# Patient Record
Sex: Male | Born: 1985 | Race: Black or African American | Hispanic: No | Marital: Single | Smoking: Never smoker
Health system: Southern US, Community
[De-identification: ages and names within clinical notes are randomized; demographics above are authoritative.]

## PROBLEM LIST (undated history)

## (undated) DIAGNOSIS — T7840XA Allergy, unspecified, initial encounter: Secondary | ICD-10-CM

## (undated) DIAGNOSIS — F32A Depression, unspecified: Secondary | ICD-10-CM

## (undated) DIAGNOSIS — M419 Scoliosis, unspecified: Secondary | ICD-10-CM

## (undated) DIAGNOSIS — F329 Major depressive disorder, single episode, unspecified: Secondary | ICD-10-CM

## (undated) HISTORY — DX: Depression, unspecified: F32.A

## (undated) HISTORY — DX: Scoliosis, unspecified: M41.9

## (undated) HISTORY — DX: Major depressive disorder, single episode, unspecified: F32.9

## (undated) HISTORY — DX: Allergy, unspecified, initial encounter: T78.40XA

---
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
## 2009-10-15 ENCOUNTER — Ambulatory Visit: Payer: Self-pay | Admitting: Family

## 2009-10-15 DIAGNOSIS — F341 Dysthymic disorder: Secondary | ICD-10-CM | POA: Insufficient documentation

## 2009-10-15 DIAGNOSIS — N529 Male erectile dysfunction, unspecified: Secondary | ICD-10-CM

## 2009-10-15 DIAGNOSIS — K5289 Other specified noninfective gastroenteritis and colitis: Secondary | ICD-10-CM | POA: Insufficient documentation

## 2009-10-15 LAB — CONVERTED CEMR LAB
Basophils Absolute: 0 10*3/uL (ref 0.0–0.1)
Basophils Relative: 0 % (ref 0–1)
Bilirubin, Direct: 0.2 mg/dL (ref 0.0–0.3)
Indirect Bilirubin: 0.8 mg/dL (ref 0.0–0.9)
Lipase: 20 units/L (ref 0–75)
MCHC: 33.4 g/dL (ref 30.0–36.0)
Neutro Abs: 5.5 10*3/uL (ref 1.7–7.7)
Neutrophils Relative %: 68 % (ref 43–77)
RBC: 5.35 M/uL (ref 4.22–5.81)
RDW: 13.3 % (ref 11.5–15.5)
Testosterone: 432.32 ng/dL (ref 350–890)
Total Bilirubin: 1 mg/dL (ref 0.3–1.2)

## 2009-10-17 ENCOUNTER — Telehealth: Payer: Self-pay | Admitting: Family

## 2009-10-30 ENCOUNTER — Ambulatory Visit: Payer: Self-pay | Admitting: Licensed Clinical Social Worker

## 2009-10-31 ENCOUNTER — Telehealth: Payer: Self-pay | Admitting: Family

## 2009-11-04 ENCOUNTER — Telehealth: Payer: Self-pay | Admitting: Family

## 2009-11-13 ENCOUNTER — Encounter (INDEPENDENT_AMBULATORY_CARE_PROVIDER_SITE_OTHER): Payer: Self-pay | Admitting: *Deleted

## 2010-01-14 ENCOUNTER — Ambulatory Visit: Payer: Self-pay | Admitting: Family

## 2010-01-14 ENCOUNTER — Encounter: Payer: Self-pay | Admitting: Family

## 2010-01-14 ENCOUNTER — Encounter (INDEPENDENT_AMBULATORY_CARE_PROVIDER_SITE_OTHER): Payer: Self-pay | Admitting: *Deleted

## 2010-01-16 LAB — CONVERTED CEMR LAB
Sex Hormone Binding: 25 nmol/L (ref 13–71)
Testosterone Free: 118 pg/mL (ref 47.0–244.0)

## 2010-02-20 ENCOUNTER — Encounter: Payer: Self-pay | Admitting: Family

## 2010-02-25 NOTE — Letter (Signed)
Summary: [place] No Show Letter  [place] at [facility]  [address]   [place], [place] [address]   Phone: [phone]  Fax: [phone]    11/13/2009 MRN: [id number]  Raymond Ho [address] [place], [place]  [address]   Dear [name],   Our records indicate that you missed your scheduled appointment with Sandford Craze  on 11-13-2009.  Please contact this office to reschedule your appointment as soon as possible.  It is important that you keep your scheduled appointments with your physician, so we can provide you the best care possible.  Please be advised that there may be a charge for "no show" appointments.    Sincerely,   [place] at [facility]

## 2010-02-25 NOTE — Letter (Signed)
Summary: Records Dated 02-10-05 thru 05-14-08/[facility]  Records Dated 02-10-05 thru 05-14-08/[facility]   Imported By: Lanelle Bal 10/31/2009 08:56:47  _____________________________________________________________________  External Attachment:    Type:   Image     Comment:   External Document

## 2010-02-25 NOTE — Progress Notes (Signed)
Summary: Cymbalta  alternatives  Phone Note Call from Patient Call back at Home Phone [phone]   Caller: Patient Summary of Call: pt states [facility] will not fill Cymbalta without him trying something else, pls contact pt Initial call taken by: Lannette Donath,  October 17, 2009 3:38 PM  Follow-up for Phone Call        Left message on machine to return my call. Nicki Guadalajara Fergerson CMA Duncan Dull)  October 17, 2009 3:43 PM   Pt states he spoke to [employer] and they told him there are other preferred alternatives to Cymbalta. Called [facility] and reached voicemail. Left detailed message for them to call us back with possible alternatives. Nicki Guadalajara Fergerson CMA Duncan Dull)  October 17, 2009 4:52 PM   Additional Follow-up for Phone Call Additional follow up Details #1::        Per Minerva Areola @ [facility] [phone]: Bupropion & Venlafaxine are preferreed alternatives. Please advise. Nicki Guadalajara Fergerson CMA Duncan Dull)  October 21, 2009 2:30 PM     Additional Follow-up for Phone Call Additional follow up Details #2::    Please call patient and let him know that due to insurance coverage, he should stop Cymbalta and start Effexor.  I have sent 1 month supply to local pharmacy, and sent additional rx to his mail order.  He should still plan to f/u in 1 month. Follow-up by: Lemont Fillers FNP,  October 21, 2009 4:59 PM  Additional Follow-up for Phone Call Additional follow up Details #3:: Details for Additional Follow-up Action Taken: Pt notified of rx change. Pt never picked Cymbalta up. F/u appt scheduled for 11/13/09 @ 3:45pm. Mervin Kung CMA (AAMA)  October 21, 2009 5:28 PM   New/Updated Medications: VENLAFAXINE HCL 37.5 MG TABS (VENLAFAXINE HCL) one tablet by mouth two times a day VENLAFAXINE HCL 37.5 MG TABS (VENLAFAXINE HCL) one tablet by mouth two times a day Prescriptions: VENLAFAXINE HCL 37.5 MG TABS (VENLAFAXINE HCL) one tablet by mouth two times a day   #180 x 0   Entered and Authorized by:   Lemont Fillers FNP   Signed by:   Lemont Fillers FNP on 10/21/2009   Method used:   Electronically to        [facility] [address] (mail-order)       [facility]       [address]       [place], [place]  [address]       Ph: [phone]       Fax: [phone]   RxID:   [id number] VENLAFAXINE HCL 37.5 MG TABS (VENLAFAXINE HCL) one tablet by mouth two times a day  #60 x 0   Entered and Authorized by:   Lemont Fillers FNP   Signed by:   Lemont Fillers FNP on 10/21/2009   Method used:   Electronically to        [facility] [address] (retail)       [phone]       [place], [place]  [address]       Ph: [phone]       Fax: [phone]   RxID:   [id number]

## 2010-02-25 NOTE — Progress Notes (Signed)
Summary: Cialis Rx  Phone Note Call from Patient Call back at Home Phone [phone]   Caller: Mom Reason for Call: Talk to Nurse Summary of Call: pt states he was only able to get half of his Rx of Cialis at pharmacy, pharmacist told him he would have to get in contact with Dr before they would fill rest of Rx, pls call pt Initial call taken by: Lannette Donath,  October 31, 2009 2:53 PM  Follow-up for Phone Call        Pt wanted to verify the quantity that Cialis was written for. He states he only got 6 from the pharmacy. Advised pt that #6 is the quantity that was prescribed. Pt satisfied with answer. Nicki Guadalajara Fergerson CMA Duncan Dull)  November 01, 2009 11:45 AM

## 2010-02-25 NOTE — Letter (Signed)
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 [address]   [place], [place] [address]   Phone: [phone]  Fax: [phone]      October 15, 2009   Employee:  LOYE VENTO    To Whom It May Concern:   For Medical reasons, please excuse the above named employee from work for the following dates:  Start:   10-15-09  End:   10-15-09  If you need additional information, please feel free to contact our office.         Sincerely,    Mervin Kung CMA (AAMA)

## 2010-02-25 NOTE — Assessment & Plan Note (Signed)
Summary: NEW PT UPSET STOMACH/DT--Rm 4   Vital Signs:  Patient profile:   25 year old male Height:      140 inches Weight:      70 pounds BMI:     2.52 Temp:     98.2 degrees F oral Pulse rate:   66 / minute Pulse rhythm:   regular Resp:     16 per minute BP sitting:   130 / 84  (right arm) Cuff size:   regular  Vitals Entered By: Mervin Kung CMA Duncan Dull) (October 15, 2009 10:23 AM) CC: Rm 4  New pt to establish care. Has had nausea and dizziness x 1 wk. Intermittent vomiting and diarrhea., Depression Is Patient Diabetic? No Pain Assessment Patient in pain? no        CC:  Rm 4  New pt to establish care. Has had nausea and dizziness x 1 wk. Intermittent vomiting and diarrhea. and Depression.  History of Present Illness: Raymond Ho is a 25 year old male who presents today to establish care.  He has several concerns today.  1)Nausea, dizziness, occasional diarrhea, vomitting last night.  Started about 2 weeks ago.  There were some work colleagues who had gi bugs.  He had mild fever last night (tactile temp per girlfriend).   Denies coffee ground emesis,  sometimes water- color is normal brown.  Denies abdominal pain.  Mild cough, mild sore throat one morning.    2) Depression- has been treated with lexapro in the past.  + anxiety- stress with work, mom depends on him for transportation, has son in [place]- lives with his mom.   sometimes hard to get up in the AM.  Denies suicide ideation.   Had panic attack on Saturday following argument with his girlfriend.    3) Erectile Dysfunction-  patient reports that the last 3 times that he has tried to engage in sexual activity,  he has been unable to acheive an erection.  Now he is scared to attempt intercourse for fear of the same.  This has been causing some stress in his relationship with his girlfriend.  Reports that his libido is "normal."  Depression History:      The patient denies significant weight gain, insomnia, and  fatigue (loss of energy).        Comments:  has some low energy/anxiety.   Preventive Screening-Counseling & Management  Alcohol-Tobacco     Alcohol drinks/day: <1     Smoking Status: never  Caffeine-Diet-Exercise     Caffeine use/day: 2 coffees or tea     Does Patient Exercise: no  Allergies (verified): No Known Drug Allergies  Past History:  Past Medical History: Depression Allergies Scoliosis  Past Surgical History: Scoliosis--2002  Family History: Mother-- living, HTN Father-- living, ?liver prob.-  ?ETOH 1 sister-- healthy 1 son--Braden- November 2010 healthy  Social History: Occupation: Holiday representative, works at [employer]. attending college at [employer]- has own apartment, but spends most of his time at girlfriend's apt. Never Smoked Alcohol use-yes, 2-3 drinks on weekends Regular exercise-no Smoking Status:  never Caffeine use/day:  2 coffees or tea Does Patient Exercise:  no  Review of Systems       Constitutional: low grade fever ENT:  Denies nasal congestion or mild sore throat- resolved Resp: + dry cough CV:  Denies Chest Pain, mild SOB GI:  see HPI GU: Denies dysuria, + urinary frequency Lymphatic: Denies lymphadenopathy Musculoskeletal:  Denies muscle/joint pain- occasional back pain Skin:  Denies  Rashes or lesions Psychiatric: see HPI Neuro: occasional numbness in left foot- rarely     Physical Exam  General:  Thin AA male, pleasant, awake, alert, and in NAD Head:  Normocephalic and atraumatic without obvious abnormalities. No apparent alopecia or balding. Neck:  No deformities, masses, or tenderness noted. Lungs:  Normal respiratory effort, chest expands symmetrically. Lungs are clear to auscultation, no crackles or wheezes. Heart:  Normal rate and regular rhythm. S1 and S2 normal without gallop, murmur, click, rub or other extra sounds. Abdomen:  Bowel sounds positive,abdomen soft and non-tender without masses, organomegaly or  hernias noted. Psych:  Oriented X3, normally interactive, good eye contact, not anxious appearing, and not depressed appearing.     Impression & Recommendations:  Problem # 1:  ANXIETY DEPRESSION (ICD-300.4) Assessment Deteriorated Clnically deteriorated.  Has tried Lexapro in the past with minimal improvement.  Will give patient trial of cymbalta.  Pt was instructed to call us if he develops worsening anxiety/depression- go to ER if he develops suicidal thoughts.  Pt verbalizes understanding.    Problem # 2:  GASTROENTERITIS, ACUTE (ICD-558.9) Assessment: New Suspect that his GI complaints are due to acute viral illness.  Will check baseline labs.  If symptoms worsen or are not improved, will consider abdominal ultrasound +/- GI referral.  His updated medication list for this problem includes:    Zofran 4 Mg Tabs (Ondansetron hcl) ..... 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  Will plan to check testosterone level and will give pt. a trial of cialis.  His updated medication list for this problem includes:    Cialis 5 Mg Tabs (Tadalafil) .Marland Kitchen... 1 tablet by mouth 1 hour before sexual activity  Orders: T-Testosterone; Total [phone])  Complete Medication List: 1)  Cymbalta 30 Mg Cpep (Duloxetine hcl) .... One tablet by mouth daily x 1 week, then increase to one tablet by mouth two times a day on second week 2)  Zofran 4 Mg Tabs (Ondansetron hcl) .... One tablet by mouth three times a day as needed nausea 3)  Cialis 5 Mg Tabs (Tadalafil) .Marland Kitchen.. 1 tablet by mouth 1 hour before sexual activity  Patient Instructions: 1)  Please follow up in 1 week, call sooner if symptoms worsen or do not improve.  2)  It will likely  take several weeks before you will notice improvement. 3)  Side effects of this medicine may include drowsiness or nausea.  If this becomes an issue for you call for further instructions. 4)  Very rarely people may develop suicidal thoughts when taking these types of medicines- should this happen to you, discontinue medication and go directly to the emergency room. Prescriptions: CIALIS 5 MG TABS (TADALAFIL) 1 tablet by mouth 1 hour before sexual activity  #6 x 2   Entered and Authorized by:   Lemont Fillers FNP   Signed by:   Lemont Fillers FNP on 10/15/2009   Method used:   Electronically to        [facility] [address] (retail)       [phone]       [place], [place]  [address]       Ph: [phone]       Fax: [phone]  RxID:   [id number] ZOFRAN 4 MG TABS (ONDANSETRON HCL) one tablet by mouth three times a day as needed nausea  #30 x 0   Entered and Authorized by:   Lemont Fillers FNP   Signed by:   Lemont Fillers FNP on 10/15/2009   Method used:   Electronically to        [facility] [address] (retail)       [phone]       [place], [place]  [address]       Ph: [phone]       Fax: [phone]   RxID:   [id number] CYMBALTA 30 MG CPEP (DULOXETINE HCL) one tablet by mouth daily x 1 week, then increase to one tablet by mouth two times a day on second week  #60 x 1   Entered and Authorized by:   Lemont Fillers FNP   Signed by:   Lemont Fillers FNP on 10/15/2009   Method used:   Electronically to        [facility] [address] (retail)       [phone]       [place], [place]  [address]       Ph: [phone]       Fax: [phone]   RxID:   [id number]   Current Allergies (reviewed today): No known allergies    Preventive Care Screening  Last Tetanus Booster:    Date:  04/26/2009    Results:  Historical      Appended Document: NEW PT UPSET STOMACH/DT--Rm 4     Allergies: No Known Drug Allergies   Impression & Recommendations:  Problem # 1:  ANXIETY DEPRESSION (ICD-300.4)  Orders: Psychology Referral (Psychology)  Complete Medication List: 1)  Cymbalta 30 Mg Cpep (Duloxetine hcl) .... One tablet by mouth daily x 1 week, then increase to one tablet by mouth two times a day on second week 2)  Zofran 4 Mg Tabs (Ondansetron hcl) .... One tablet by mouth three times a day as needed nausea 3)  Cialis 5 Mg Tabs (Tadalafil) .Marland Kitchen.. 1 tablet by mouth 1 hour before sexual activity

## 2010-02-25 NOTE — Progress Notes (Signed)
Summary: Cialis Refill  Phone Note Call from Patient Call back at Home Phone [phone]   Caller: Patient Call For: Lemont Fillers FNP Summary of Call: patient was seen 3 week ago and he was given a rx for Cialis, he would like to know if he could get a refill, he is out of the medication. He was informed a rx was sent to the pharmacy on 9/20 with a refill. He was advised to check with the pharmacy for refill of medication Initial call taken by: Glendell Docker CMA,  November 04, 2009 2:55 PM

## 2010-02-27 NOTE — Assessment & Plan Note (Signed)
Summary: 2 MONTH FOLLOW UP/MHF--Rm 4   Vital Signs:  Patient profile:   25 year old male Height:      70 inches Weight:      150 pounds BMI:     21.60 Temp:     98.3 degrees F oral Pulse rate:   66 / minute Pulse rhythm:   regular Resp:     16 per minute BP sitting:   140 / 88  (right arm) Cuff size:   regular  Vitals Entered By: Mervin Kung CMA Duncan Dull) (January 14, 2010 8:47 AM) CC: Pt here for 2 month follow up. Is Patient Diabetic? No Pain Assessment Patient in pain? no      Comments Pt states he has not been using Cialis. Nicki Guadalajara Fergerson CMA Duncan Dull)  January 14, 2010 8:52 AM    CC:  Pt here for 2 month follow up.Marland Kitchen  History of Present Illness: Raymond Ho is a 24 year old male who presents for follow up.  1)Depression/anxiety- + nausea with the venlafaxine- takes with food, improves.  Not as overwhelmed.  Has not been procrastinating.  Less depressed, fewer "blue" days.  Wishes to continue effexor  2) Hx of Gastroenterities GI symptoms are resolved.    3) Cialis- not working.  Does have spontaneous AM erections, but difficulty maintaining erection.  Causing stress in his relationship.  Allergies (verified): No Known Drug Allergies  Past History:  Past Medical History: Last updated: 10/15/2009 Depression Allergies Scoliosis  Past Surgical History: Last updated: 10/15/2009 Scoliosis--2002  Review of Systems       see HPI  Physical Exam  General:  Well-developed,well-nourished,in no acute distress; alert,appropriate and cooperative throughout examination Lungs:  Normal respiratory effort, chest expands symmetrically. Lungs are clear to auscultation, no crackles or wheezes. Heart:  Normal rate and regular rhythm. S1 and S2 normal without gallop, murmur, click, rub or other extra sounds. Psych:  Cognition and judgment appear intact. Alert and cooperative with normal attention span and concentration. No apparent delusions, illusions,  hallucinations   Impression & Recommendations:  Problem # 1:  ANXIETY DEPRESSION (ICD-300.4) Assessment Improved Symptoms are improved.  Insurance would not cover cymbalta.  He reports that side effects are tolerable.  Wishes to continue Effexor.  15 minutes spent with patient.  Greater than 50% of this time was spent counseling pt on his depression and anxiety.  Problem # 2:  ERECTILE DYSFUNCTION, ORGANIC (ICD-607.84) Check Testoterone level, refer to Urology for formal evaluation.  Stop Cialis- not helping.  The following medications were removed from the medication list:    Cialis 5 Mg Tabs (Tadalafil) .Marland Kitchen... 1 tablet by mouth 1 hour before sexual activity  Orders: T-Testosterone, Free and Total [phone]) Urology Referral (Urology)  Complete Medication List: 1)  Venlafaxine Hcl 37.5 Mg Tabs (Venlafaxine hcl) .... One tablet by mouth two times a day  Patient Instructions: 1)  You will be contacted about your referral to Urologist. 2)  Please complete your lab work on the first floor.  3)  Please follow up in 3 months, sooner if problems or concerns.   Prescriptions: VENLAFAXINE HCL 37.5 MG TABS (VENLAFAXINE HCL) one tablet by mouth two times a day  #180 x 0   Entered and Authorized by:   Lemont Fillers FNP   Signed by:   Lemont Fillers FNP on 01/14/2010   Method used:   Electronically to        [facility] [address] (retail)  [address]       [place], [place]  [address]       Ph: [phone]       Fax: [phone]   RxID:   [id number]    Orders Added: 1)  T-Testosterone, Free and Total [phone] 2)  Urology Referral [Urology] 3)  Est. Patient Level III [[address]]    Contraindications/Deferment of Procedures/Staging:    Test/Procedure: FLU VAX    Reason for deferment: patient declined   Current Allergies (reviewed today): No known allergies

## 2010-02-27 NOTE — Letter (Signed)
Summary: Primary Care Consult Scheduled Letter  [place] at [facility]  [address]   [place], [place] [address]   Phone: [phone]  Fax: [phone]      01/14/2010 MRN: [id number]  Raymond Ho [address] [place], [place]  [address]    Dear Mr. [place],      We have scheduled an appointment for you.  At the recommendation of MELISSA O'SULLIVAN, FNP , we have scheduled you a consult with [facility], DR Retta Diones  on February 20, 2010  at 11AM.  Their address is_[facility], [address],[place]  . The office phone number is [phone].  If this appointment day and time is not convenient for you, please feel free to call the office of the doctor you are being referred to at the number listed above and reschedule the appointment.     It is important for you to keep your scheduled appointments. We are here to make sure you are given good patient care.   Thank you,  Darral Dash Patient Care Coordinator [place] at [facility]

## 2010-03-13 NOTE — Consult Note (Signed)
Summary: [facility]  [facility]   Imported By: Lanelle Bal 03/07/2010 11:21:06  _____________________________________________________________________  External Attachment:    Type:   Image     Comment:   External Document

## 2010-04-08 ENCOUNTER — Ambulatory Visit: Payer: Self-pay | Admitting: Family

## 2010-04-14 ENCOUNTER — Ambulatory Visit: Payer: Self-pay | Admitting: Family

## 2010-04-15 ENCOUNTER — Encounter: Payer: Self-pay | Admitting: Family

## 2010-04-16 ENCOUNTER — Encounter: Payer: Self-pay | Admitting: Family

## 2010-04-16 ENCOUNTER — Ambulatory Visit (INDEPENDENT_AMBULATORY_CARE_PROVIDER_SITE_OTHER): Payer: 59 | Admitting: Family

## 2010-04-16 ENCOUNTER — Telehealth: Payer: Self-pay | Admitting: Family

## 2010-04-16 ENCOUNTER — Other Ambulatory Visit: Payer: Self-pay | Admitting: Family

## 2010-04-16 ENCOUNTER — Ambulatory Visit (HOSPITAL_BASED_OUTPATIENT_CLINIC_OR_DEPARTMENT_OTHER)
Admission: RE | Admit: 2010-04-16 | Discharge: 2010-04-16 | Disposition: A | Discharge status: Home / Self Care | Payer: 59 | Source: Ambulatory Visit | Attending: Family | Admitting: Family

## 2010-04-16 VITALS — BP 140/78 | HR 60 | Temp 98.7°F | Resp 16 | Ht 70.0 in | Wt 151.1 lb

## 2010-04-16 DIAGNOSIS — T84498A Other mechanical complication of other internal orthopedic devices, implants and grafts, initial encounter: Secondary | ICD-10-CM | POA: Insufficient documentation

## 2010-04-16 DIAGNOSIS — N529 Male erectile dysfunction, unspecified: Secondary | ICD-10-CM

## 2010-04-16 DIAGNOSIS — M419 Scoliosis, unspecified: Secondary | ICD-10-CM

## 2010-04-16 DIAGNOSIS — M545 Low back pain, unspecified: Secondary | ICD-10-CM

## 2010-04-16 DIAGNOSIS — M549 Dorsalgia, unspecified: Secondary | ICD-10-CM

## 2010-04-16 DIAGNOSIS — M4120 Other idiopathic scoliosis, site unspecified: Secondary | ICD-10-CM

## 2010-04-16 DIAGNOSIS — F341 Dysthymic disorder: Secondary | ICD-10-CM

## 2010-04-16 DIAGNOSIS — R030 Elevated blood-pressure reading, without diagnosis of hypertension: Secondary | ICD-10-CM | POA: Insufficient documentation

## 2010-04-16 DIAGNOSIS — Y831 Surgical operation with implant of artificial internal device as the cause of abnormal reaction of the patient, or of later complication, without mention of misadventure at the time of the procedure: Secondary | ICD-10-CM | POA: Insufficient documentation

## 2010-04-16 MED ORDER — VENLAFAXINE HCL 37.5 MG PO TABS: 37.5000 mg | ORAL_TABLET | Freq: Two times a day (BID) | ORAL | Status: DC

## 2010-04-16 NOTE — Patient Instructions (Signed)
You may use Aleve 220mg  twice daily as needed for back pain. Please complete your x-ray on the first floor today. Follow up in 3 months, sooner if worsening low back pain.

## 2010-04-16 NOTE — Assessment & Plan Note (Signed)
Patient anxiety and depression appear well-controlled at this time. Plan to continue current dose of Effexor.

## 2010-04-16 NOTE — Assessment & Plan Note (Signed)
This appears to be resolved. Will monitor.

## 2010-04-16 NOTE — Assessment & Plan Note (Signed)
Patient is noted to have systolic blood pressure today 161. He notes that his mother has history of high blood pressure. I have advised him to follow a low-sodium diet. We will repeat his blood pressure next visit.

## 2010-04-16 NOTE — Telephone Encounter (Signed)
Reviewed x-ray results which note fracture of spinal rod in L2-3 region and plan for  referral to neurosurgery.  Pt to call us if he develops severe or worsening low back pain.

## 2010-04-16 NOTE — Progress Notes (Signed)
  Subjective:    Patient ID: Raymond Ho, male    DOB: 03/07/1985, 25 y.o.   MRN: [id number]  HPI  1) Anxiety/Depression-  Feels like he is better able to prioritize. No further nausea with Effexor.  Denies panic attacks. Rare "blue days."  Sleeping well.  2) Low back pain- hx of spinal surgery at age 49 with hardware, due to scoliosis.  3) Erectile dysfunction- ED seems resolved.  Had tried daily cialis.  Now off.  Saw Urology.  He feels that this has resolved.  Past Medical History  Diagnosis Date  . Allergy   . Depression   . Scoliosis     History   Social History  . Marital Status: Single    Spouse Name: N/A    Number of Children: 1  . Years of Education: N/A   Occupational History  . HEALTH CARE BILLING    Social History Main Topics  . Smoking status: Never Smoker   . Smokeless tobacco: Never Used  . Alcohol Use: 1.2 - 1.8 oz/week    2-3 Cans of beer per week  . Drug Use: Not on file  . Sexually Active: Not on file   Other Topics Concern  . Not on file   Social History Narrative   Regular exercise:  no    Past Surgical History  Procedure Date  . Spine surgery 2002    for scoliosis    Family History  Problem Relation Age of Onset  . Hypertension Mother   . Alcohol abuse Father   . Liver disease Father     No Known Allergies  Current Outpatient Prescriptions on File Prior to Visit  Medication Sig Dispense Refill  . DISCONTD: venlafaxine (EFFEXOR) 37.5 MG tablet Take 37.5 mg by mouth 2 (two) times daily.             Review of Systems See history of present illness. MS:  Low back pain/right low back pain.  Worse on the damp/rainy days.   Hard to sleep on those days.      Objective:   Physical Exam  General: Pleasant African American male awake and alert and in no acute distress.  Cardiovascular S1-S2 regular rate and rhythm no murmurs noted Respiratory: Breath sounds clear to auscultation bilaterally without wheezes rales or  rhonchi Musculoskeletal: No thoracic or lumbar spine tenderness to palpation. Bilateral lower extremity strength is 5 out of 5 Neuro: Steady even gait. 2+ patellar reflexes bilaterally. Psychiatric: Patient is alert and oriented x3. He is calm and pleasant. He does not appear anxious or depressed.       Assessment & Plan:

## 2010-04-16 NOTE — Assessment & Plan Note (Addendum)
25 year old male with history of scoliosis. He is status post hardware placement at age 54. He notes some recent increase in pain. X-ray notes fracture of spinal rod in area of L2-3.  Will refer to neurosurgery.  See phone note.

## 2010-07-14 ENCOUNTER — Ambulatory Visit: Payer: 59 | Admitting: Family

## 2010-07-18 ENCOUNTER — Encounter: Payer: Self-pay | Admitting: Family

## 2010-07-18 ENCOUNTER — Ambulatory Visit: Payer: 59 | Admitting: Family

## 2010-07-18 DIAGNOSIS — Z0289 Encounter for other administrative examinations: Secondary | ICD-10-CM

## 2012-11-05 IMAGING — CR DG THORACIC SPINE 4+V
3 series · 3 of 3 positions shown · non-contrast
Comparison: None.

CLINICAL DATA: Scoliosis with back pain and history of spinal
fusion.

THORACIC SPINE - 4+ VIEW

[w t-spine a.p. *]
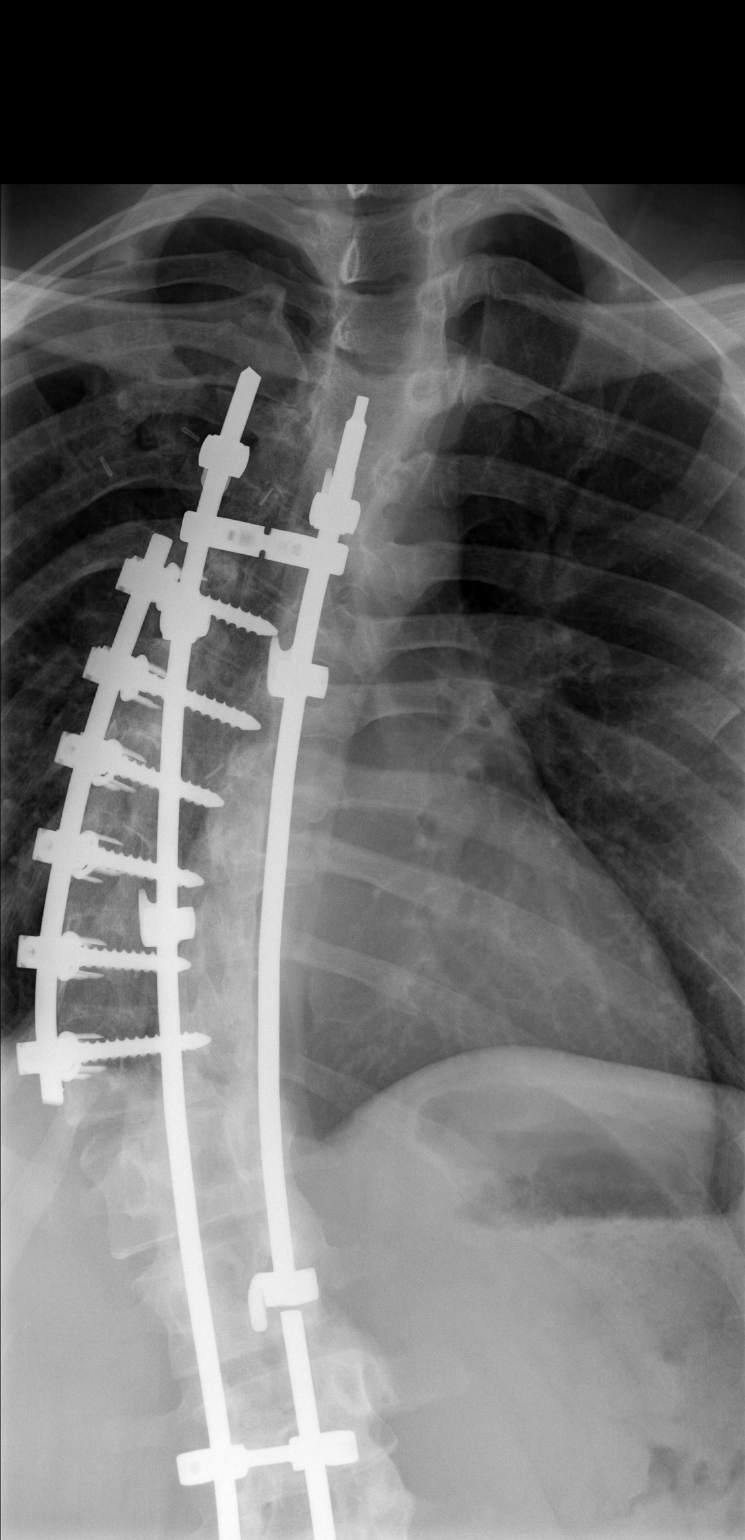

[w t-spine lat *]
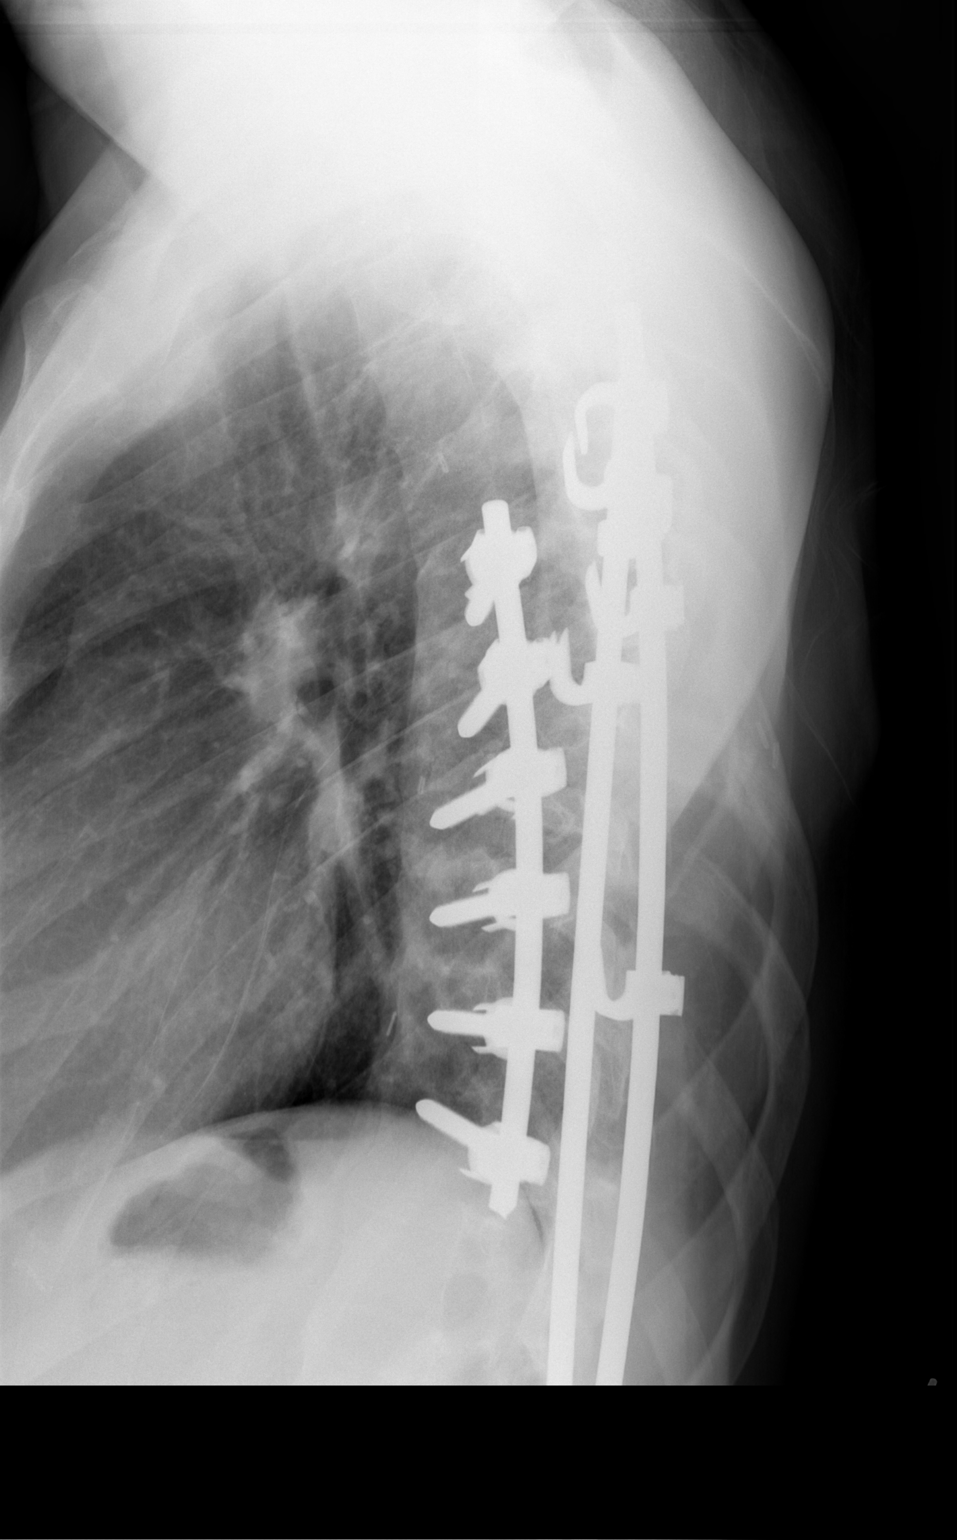

[w swimmers view]
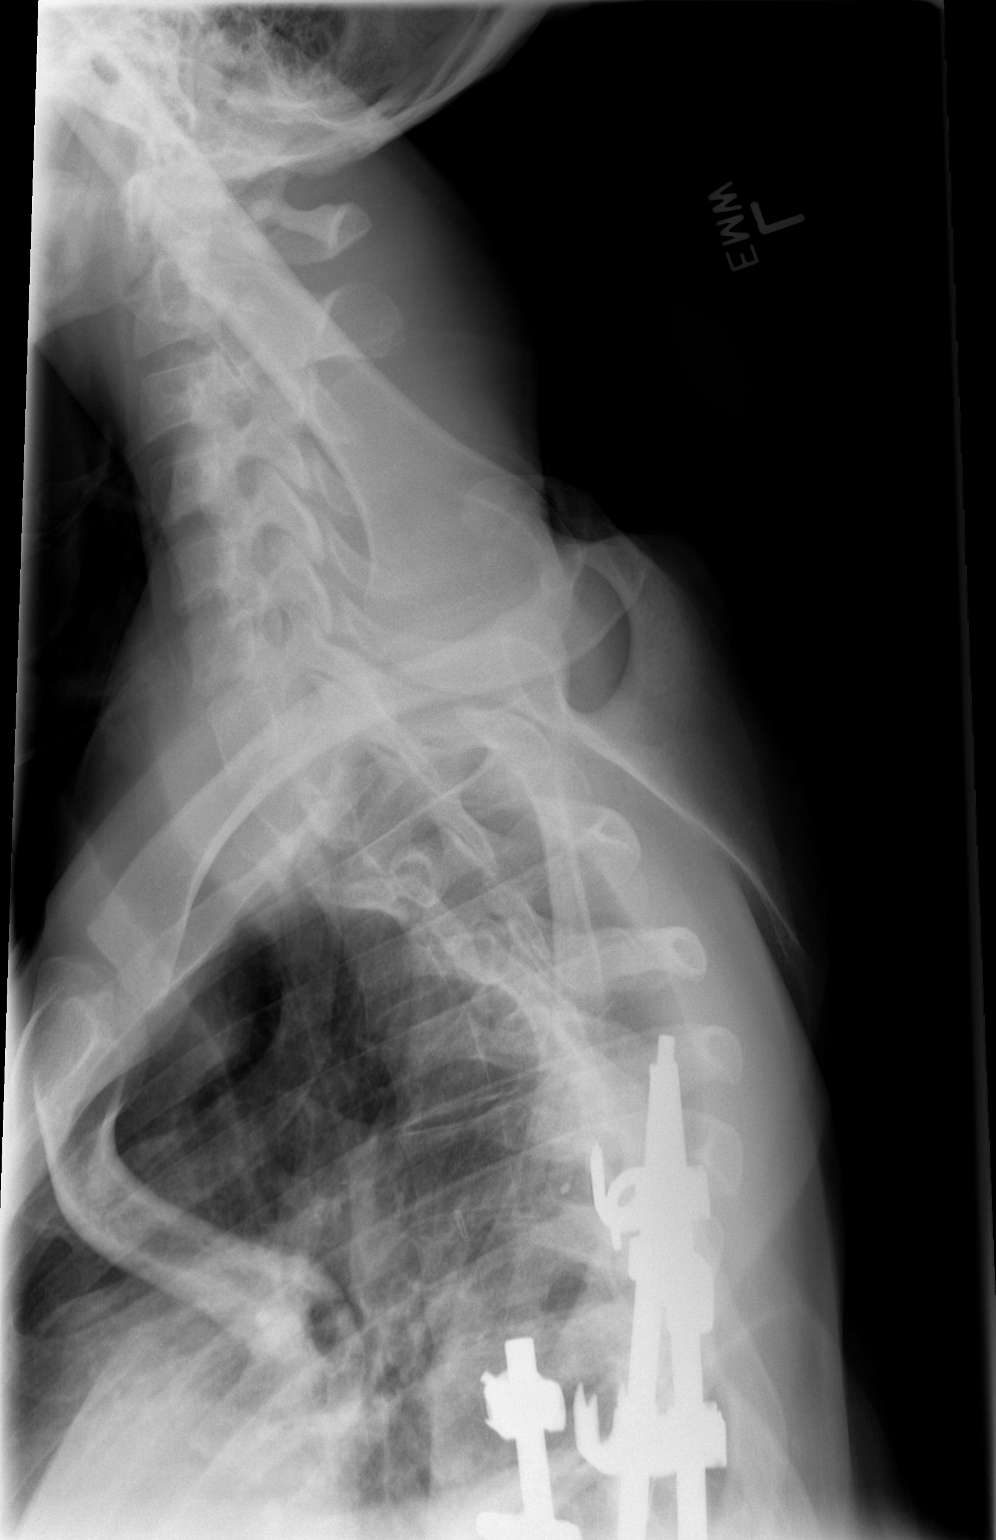

[3 of 3 positions shown; findings below may reference images not displayed]

FINDINGS: Convex rightward lumbar scoliosis noted with apex about
the level of T11.  Reviewing this film along the lumbar spine study
performed the same time, the patient has bilateral spinal fusion
rods which extend from the T6 level caudally to the L5 level.  Left-
sided spinal fusion rod is fractured just caudal to the left L2
pedicle.  The patient has a third lateral spinal fusion rod in the
thoracic spine with lateral screws in all vertebral bodies from the
T7 level to the T12 level.
IMPRESSION: Extensive thoracolumbar spinal fusion secondary to S-shaped
thoracolumbar scoliosis.  Left side the spinal fusion rod is
fractured.

## 2012-11-18 ENCOUNTER — Ambulatory Visit (INDEPENDENT_AMBULATORY_CARE_PROVIDER_SITE_OTHER): Payer: 59 | Admitting: Family

## 2012-11-18 ENCOUNTER — Encounter: Payer: Self-pay | Admitting: Family

## 2012-11-18 ENCOUNTER — Encounter (INDEPENDENT_AMBULATORY_CARE_PROVIDER_SITE_OTHER): Payer: Self-pay

## 2012-11-18 VITALS — BP 130/86 | HR 52 | Temp 98.3°F | Resp 16 | Ht 69.0 in | Wt 162.1 lb

## 2012-11-18 DIAGNOSIS — Z0000 Encounter for general adult medical examination without abnormal findings: Secondary | ICD-10-CM | POA: Insufficient documentation

## 2012-11-18 LAB — LIPID PANEL
Cholesterol: 166 mg/dL (ref 0–200)
HDL: 53 mg/dL (ref >39)
Total CHOL/HDL Ratio: 3.1 Ratio
Triglycerides: 68 mg/dL (ref <150)
VLDL: 14 mg/dL (ref 0–40)

## 2012-11-18 LAB — BASIC METABOLIC PANEL WITH GFR
BUN: 13 mg/dL (ref 6–23)
Calcium: 9.4 mg/dL (ref 8.4–10.5)
GFR, Est African American: >89 mL/min
GFR, Est Non African American: >89 mL/min
Glucose, Bld: 88 mg/dL (ref 70–99)
Potassium: 4.4 mEq/L (ref 3.5–5.3)

## 2012-11-18 LAB — CBC WITH DIFFERENTIAL/PLATELET
Basophils Absolute: 0.1 10*3/uL (ref 0.0–0.1)
Basophils Relative: 1 % (ref 0–1)
Eosinophils Absolute: 0.2 10*3/uL (ref 0.0–0.7)
Hemoglobin: 15.8 g/dL (ref 13.0–17.0)
MCH: 28.8 pg (ref 26.0–34.0)
MCHC: 35.1 g/dL (ref 30.0–36.0)
Neutro Abs: 2.4 10*3/uL (ref 1.7–7.7)
Neutrophils Relative %: 40 % — ABNORMAL LOW (ref 43–77)
Platelets: 386 10*3/uL (ref 150–400)
RDW: 13.2 % (ref 11.5–15.5)

## 2012-11-18 LAB — HEPATIC FUNCTION PANEL
AST: 15 U/L (ref 0–37)
Albumin: 4.3 g/dL (ref 3.5–5.2)
Bilirubin, Direct: 0.1 mg/dL (ref 0.0–0.3)
Total Bilirubin: 0.6 mg/dL (ref 0.3–1.2)

## 2012-11-18 NOTE — Patient Instructions (Signed)
Please complete lab work prior to leaving. Schedule a follow up visit so we can address some of your other medical concerns.

## 2012-11-18 NOTE — Assessment & Plan Note (Signed)
Obtain fasting blood work.  Discussed healthy diet, exercise.

## 2012-11-18 NOTE — Progress Notes (Signed)
Subjective:    Patient ID: Raymond Ho, male    DOB: 1985-11-01, 27 y.o.   MRN: [id number]  HPI  Raymond Ho is a 27 yr old male who presents today for cpx.   Patient presents today for complete physical.  Immunizations:  He will get his flu shot next week at work. Diet:reports diet could be better. Exercise: starting to exercise more.  Does treadmill 20 minutes 3 days a week.   The patient wishes to return another day to address anxiety/depression.   Review of Systems  Constitutional: Negative for unexpected weight change.  HENT: Negative for hearing loss.   Eyes: Negative for visual disturbance.  Respiratory: Negative for shortness of breath.        Reports lingering cough from uri 2 weeks ago- improving.   Cardiovascular: Negative for chest pain.  Gastrointestinal: Negative for nausea, vomiting, diarrhea and anal bleeding.  Genitourinary: Negative for dysuria, frequency and hematuria.  Musculoskeletal: Negative for arthralgias and joint swelling.       Occasional low back pain  Skin: Negative for rash.  Neurological: Negative for headaches.  Hematological: Negative for adenopathy.   Past Medical History  Diagnosis Date  . Allergy   . Depression   . Scoliosis     History   Social History  . Marital Status: Single    Spouse Name: N/A    Number of Children: 1  . Years of Education: N/A   Occupational History  . HEALTH CARE BILLING    Social History Main Topics  . Smoking status: Never Smoker   . Smokeless tobacco: Never Used  . Alcohol Use: 1.2 - 1.8 oz/week    2-3 Cans of beer per week     Comment: 2-3 drinks on weekend  . Drug Use: Not on file  . Sexual Activity: Not on file   Other Topics Concern  . Not on file   Social History Narrative   Works at [employer] alone   Enjoys listening to music, hanging out with friends   Has a son who was born 2010. He shares custody          Past Surgical History  Procedure Laterality Date   . Spine surgery  2002    for scoliosis  . Scoliosis  2002    Family History  Problem Relation Age of Onset  . Hypertension Mother   . Alcohol abuse Father   . Liver disease Father     No Known Allergies  No current outpatient prescriptions on file prior to visit.   No current facility-administered medications on file prior to visit.    BP 130/86  Pulse 52  Temp(Src) 98.3 F (36.8 C) (Oral)  Resp 16  Ht 5\' 9"  (1.753 m)  Wt 162 lb 1.3 oz (73.521 kg)  BMI 23.92 kg/m2  SpO2 99%        Objective:   Physical Exam  Physical Exam  Constitutional: He is oriented to person, place, and time. He appears well-developed and well-nourished. No distress.  HENT:  Head: Normocephalic and atraumatic.  Right Ear: Tympanic membrane and ear canal normal.  Left Ear: Tympanic membrane and ear canal normal.  Mouth/Throat: Oropharynx is clear and moist.  Eyes: Pupils are equal, round, and reactive to light. No scleral icterus.  Neck: Normal range of motion. No thyromegaly present.  Cardiovascular: Normal rate and regular rhythm.   No murmur heard. Pulmonary/Chest: Effort normal and breath sounds normal. No respiratory  distress. He has no wheezes. He has no rales. He exhibits no tenderness.  Abdominal: Soft. Bowel sounds are normal. He exhibits no distension and no mass. There is no tenderness. There is no rebound and no guarding.  Musculoskeletal: He exhibits no edema.  Lymphadenopathy:    He has no cervical adenopathy.  Neurological: He is alert and oriented to person, place, and time.  He exhibits normal muscle tone. Coordination normal.  Skin: Skin is warm and dry.  Psychiatric: He has a normal mood and affect. His behavior is normal. Judgment and thought content normal.          Assessment & Plan:          Assessment & Plan:          Assessment & Plan:

## 2012-11-19 LAB — URINALYSIS, ROUTINE W REFLEX MICROSCOPIC
Bilirubin Urine: NEGATIVE
Glucose, UA: NEGATIVE mg/dL
Hgb urine dipstick: NEGATIVE
Leukocytes, UA: NEGATIVE
pH: 6 (ref 5.0–8.0)

## 2012-11-21 ENCOUNTER — Encounter: Payer: Self-pay | Admitting: Family

## 2012-11-23 ENCOUNTER — Ambulatory Visit (INDEPENDENT_AMBULATORY_CARE_PROVIDER_SITE_OTHER): Payer: 59 | Admitting: Family

## 2012-11-23 ENCOUNTER — Encounter: Payer: Self-pay | Admitting: Family

## 2012-11-23 VITALS — BP 130/80 | HR 66 | Temp 98.1°F | Resp 16 | Ht 69.0 in | Wt 165.0 lb

## 2012-11-23 DIAGNOSIS — F341 Dysthymic disorder: Secondary | ICD-10-CM

## 2012-11-23 MED ORDER — FLUOXETINE HCL 20 MG PO TABS: ORAL_TABLET | ORAL | Status: DC

## 2012-11-23 NOTE — Patient Instructions (Addendum)
Start Fluoxetine 20mg  1/2 tablet by mouth once daily for 1 week, then increase to a full tablet once daily. Follow up in 1 month.

## 2012-11-23 NOTE — Assessment & Plan Note (Addendum)
Patient presents with worsening depression. Mild anxiety.   Start Prozac 20mg , 1/2 tablet once daily x 1 week, then 1 tablet once daily.   I instructed pt to start 1/2 tablet once daily for 1 week and then increase to a full tablet once daily on week two as tolerated.  We discussed common side effects such as nausea, drowsiness and weight gain.  Also discussed rare but serious side effect of suicide ideation.  She is instructed to discontinue medication go directly to ED if this occurs.  Pt verbalizes understanding.  Plan follow up in 1 month to evaluate progress.    Instructed patient to go to the ED if having suicidal or homicidal thoughts.  Follow up in one month. 20 minutes spent with pt today.  >50% of this time was spent counseling pt on anxiety and depression.

## 2012-11-23 NOTE — Progress Notes (Signed)
Subjective:    Patient ID: Raymond Ho, male    DOB: Feb 14, 1985, 27 y.o.   MRN: [id number]  HPI Raymond Ho is a 27 y/o male who presents today with a chief complaint of depression and anxiety.  Patient reports he has had symptoms of depression since age 5, patient tried Lexapro at age 45, and effexor over one year ago, patient reports small changes, but did not like taking medication.  Attempted to try Cymbalta but insurance would not cover.  Patient is open to trying a new medication. Patient reports he has started to feel a decrease in drive over the past 6 to 9 months and reports 2 to 3 bad days per week.  Patient also reports Anxiety, states that he lays in bed at night with his mind racing.  Denies PTSD recently and from childhood.  Patient also denies suicidal and homicidal ideation at this time.   Review of Systems  Constitutional: Negative for activity change.  HENT: Negative for sore throat.   Respiratory: Negative for shortness of breath.   Cardiovascular: Negative for chest pain.  Gastrointestinal: Negative for nausea and vomiting.  Neurological: Negative for light-headedness and headaches.  Psychiatric/Behavioral: Positive for sleep disturbance. Negative for suicidal ideas and self-injury. The patient is nervous/anxious.        Reports anxiety and depression over last 6 to 9 months.   Past Medical History  Diagnosis Date  . Allergy   . Depression   . Scoliosis     History   Social History  . Marital Status: Single    Spouse Name: N/A    Number of Children: 1  . Years of Education: N/A   Occupational History  . HEALTH CARE BILLING    Social History Main Topics  . Smoking status: Never Smoker   . Smokeless tobacco: Never Used  . Alcohol Use: 1.2 - 1.8 oz/week    2-3 Cans of beer per week     Comment: 2-3 drinks on weekend  . Drug Use: Not on file  . Sexual Activity: Not on file   Other Topics Concern  . Not on file   Social History Narrative   Works at  [employer] alone   Enjoys listening to music, hanging out with friends   Has a son who was born 2010. He shares custody          Past Surgical History  Procedure Laterality Date  . Spine surgery  2002    for scoliosis  . Scoliosis  2002    Family History  Problem Relation Age of Onset  . Hypertension Mother   . Alcohol abuse Father   . Liver disease Father     No Known Allergies  No current outpatient prescriptions on file prior to visit.   No current facility-administered medications on file prior to visit.    BP 130/80  Pulse 66  Temp(Src) 98.1 F (36.7 C) (Oral)  Resp 16  Ht 5\' 9"  (1.753 m)  Wt 165 lb (74.844 kg)  BMI 24.36 kg/m2  SpO2 97%       Objective:   Physical Exam  Constitutional: He is oriented to person, place, and time. He appears well-nourished.  Cardiovascular: Normal rate and regular rhythm.   Pulmonary/Chest: Breath sounds normal. No respiratory distress.  Neurological: He is alert and oriented to person, place, and time.  Skin: Skin is warm and dry.  Psychiatric: He has a normal mood and affect. His  behavior is normal.          Assessment & Plan:

## 2012-12-13 ENCOUNTER — Telehealth: Payer: Self-pay | Admitting: *Deleted

## 2012-12-13 NOTE — Telephone Encounter (Signed)
Patient reports having hypersensitivity w/panic attack, Anxiety & Nausea, believes to be caused by Fluoxetine started on 10.29.14/SLS Please Advise.

## 2012-12-13 NOTE — Telephone Encounter (Signed)
Returned pt's call.  No answer. Did not leave message.   Please ask the pt how he was feeling on the prozac 10mg - if he was tolerating this better than the 20mg , then he should drop back to 10.  If he did not feel well on the 10mg , then we can try switching him to a different medication such as paxil to see if he can better tolerate.

## 2012-12-14 NOTE — Telephone Encounter (Signed)
Pt returned my call.  Reports one panic attack in the last 2 weeks on prozac 20mg .  Occasional nervousness. Advised pt to continue prozac 20mg  and keep apt on 11/24 and we will re-evaluate at that time. He verbalizes understanding.

## 2012-12-19 ENCOUNTER — Encounter: Payer: Self-pay | Admitting: Family

## 2012-12-19 ENCOUNTER — Ambulatory Visit (INDEPENDENT_AMBULATORY_CARE_PROVIDER_SITE_OTHER): Payer: 59 | Admitting: Family

## 2012-12-19 VITALS — BP 134/88 | HR 58 | Temp 98.1°F | Resp 16 | Ht 69.0 in | Wt 166.1 lb

## 2012-12-19 DIAGNOSIS — F341 Dysthymic disorder: Secondary | ICD-10-CM

## 2012-12-19 MED ORDER — VENLAFAXINE HCL ER 37.5 MG PO CP24: ORAL_CAPSULE | ORAL | Status: DC

## 2012-12-19 MED ORDER — FLUOXETINE HCL 20 MG PO TABS: 20.0000 mg | ORAL_TABLET | Freq: Every day | ORAL | Status: DC

## 2012-12-19 NOTE — Patient Instructions (Signed)
Continue prozac, add effexor. Follow up in 6 weeks.

## 2012-12-19 NOTE — Progress Notes (Signed)
Pre visit review using our clinic review tool, if applicable. No additional management support is needed unless otherwise documented below in the visit note. 

## 2012-12-19 NOTE — Progress Notes (Signed)
  Subjective:    Patient ID: Raymond Ho, male    DOB: 11-06-1985, 27 y.o.   MRN: [id number]  HPI [name] is a 27 year old male who presents today for follow up of anxiety/depression.  Patient was started on Prozac 20mg  (10mg  x 1 week, then 20mg ). Patient reports noticing a panicking feeling with racing thoughts last Sunday (8 days ago).  Patient reports generally feeling only a slight change of improvement over 4 weeks. Denies SI/HI.  Also reporting generalized unilateral frontal headache x 1 week.    Review of Systems  Constitutional: Negative for appetite change and unexpected weight change.  Respiratory: Negative for shortness of breath.   Cardiovascular: Negative for chest pain.  Neurological: Positive for headaches.       Reports left frontal headaches x 1 week intermittently.  Psychiatric/Behavioral: Positive for decreased concentration. Negative for suicidal ideas and sleep disturbance. The patient is nervous/anxious.        Reports he still feels anxious with intermittent racing thoughts.   Past Medical History  Diagnosis Date  . Allergy   . Depression   . Scoliosis     History   Social History  . Marital Status: Single    Spouse Name: N/A    Number of Children: 1  . Years of Education: N/A   Occupational History  . HEALTH CARE BILLING    Social History Main Topics  . Smoking status: Never Smoker   . Smokeless tobacco: Never Used  . Alcohol Use: 1.2 - 1.8 oz/week    2-3 Cans of beer per week     Comment: 2-3 drinks on weekend  . Drug Use: Not on file  . Sexual Activity: Not on file   Other Topics Concern  . Not on file   Social History Narrative   Works at [employer] alone   Enjoys listening to music, hanging out with friends   Has a son who was born 2010. He shares custody          Past Surgical History  Procedure Laterality Date  . Spine surgery  2002    for scoliosis  . Scoliosis  2002    Family History  Problem Relation  Age of Onset  . Hypertension Mother   . Alcohol abuse Father   . Liver disease Father     No Known Allergies  No current outpatient prescriptions on file prior to visit.   No current facility-administered medications on file prior to visit.    BP 134/88  Pulse 58  Temp(Src) 98.1 F (36.7 C) (Oral)  Resp 16  Ht 5\' 9"  (1.753 m)  Wt 166 lb 1.9 oz (75.352 kg)  BMI 24.52 kg/m2  SpO2 99%       Objective:   Physical Exam  Constitutional: He is oriented to person, place, and time. He appears well-nourished.  Cardiovascular: Normal rate, regular rhythm and normal heart sounds.   Pulmonary/Chest: Effort normal and breath sounds normal. No respiratory distress.  Neurological: He is alert and oriented to person, place, and time.  Skin: Skin is warm and dry.  Psychiatric: He has a normal mood and affect. His behavior is normal.          Assessment & Plan:

## 2012-12-19 NOTE — Assessment & Plan Note (Signed)
Patient reports slight improvement.  Continue Prozac 20mg , add effexor 37.5mg  1 tablet daily x 1 week, then 2 tablets daily ongoing. Follow up in 6 weeks.

## 2013-02-03 ENCOUNTER — Ambulatory Visit: Payer: 59 | Admitting: Family

## 2013-02-03 DIAGNOSIS — Z0289 Encounter for other administrative examinations: Secondary | ICD-10-CM

## 2013-02-13 ENCOUNTER — Ambulatory Visit: Payer: 59 | Admitting: Family

## 2013-02-20 ENCOUNTER — Encounter: Payer: Self-pay | Admitting: Family

## 2013-02-20 ENCOUNTER — Ambulatory Visit (INDEPENDENT_AMBULATORY_CARE_PROVIDER_SITE_OTHER): Payer: 59 | Admitting: Family

## 2013-02-20 VITALS — BP 138/98 | HR 71 | Temp 98.5°F | Resp 16 | Ht 69.0 in | Wt 170.1 lb

## 2013-02-20 DIAGNOSIS — F341 Dysthymic disorder: Secondary | ICD-10-CM

## 2013-02-20 DIAGNOSIS — R030 Elevated blood-pressure reading, without diagnosis of hypertension: Secondary | ICD-10-CM

## 2013-02-20 NOTE — Patient Instructions (Addendum)
Continue Effexor and Prozac as prescribed. Follow up in 2 weeks so we can recheck your blood pressure.

## 2013-02-20 NOTE — Assessment & Plan Note (Addendum)
BP Readings from Last 3 Encounters:  02/20/13 138/98  12/19/12 134/88  11/23/12 130/80   Patient noted to have high blood pressure today in office. Started Effexor in November 2014 which I suspect may be contributing. Patient to follow up in two weeks for recheck of blood pressure.

## 2013-02-20 NOTE — Assessment & Plan Note (Addendum)
Stable on medications. Continue Prozac and Effexor. Blood pressure noted to be elevated today in office. Patient to return in 2 weeks for recheck.

## 2013-02-20 NOTE — Progress Notes (Signed)
Pre visit review using our clinic review tool, if applicable. No additional management support is needed unless otherwise documented below in the visit note. 

## 2013-02-20 NOTE — Progress Notes (Signed)
Subjective:    Patient ID: Raymond Ho, male    DOB: July 26, 1985, 28 y.o.   MRN: [id number]  HPI Raymond Ho is a 28 year old male who presents today for follow up. Patient was placed on Prozac 20mg  in October 2014 and experienced several panic attacks. Patient followed up in November 2014 and added on Effexor 37.5 twice daily.  Patient reports a significant difference in his anxiety since the initiation of Effexor with the Prozac. Patient reports minor anxiety in stressful situations. Patient denies panic attacks since visit in November. Denies SI/HI.   Review of Systems  Respiratory: Negative for shortness of breath.   Cardiovascular: Negative for chest pain.  Neurological: Negative for light-headedness and headaches.  Psychiatric/Behavioral: Negative for suicidal ideas, sleep disturbance, decreased concentration and agitation. The patient is not nervous/anxious.    Past Medical History  Diagnosis Date  . Allergy   . Depression   . Scoliosis     History   Social History  . Marital Status: Single    Spouse Name: N/A    Number of Children: 1  . Years of Education: N/A   Occupational History  . HEALTH CARE BILLING    Social History Main Topics  . Smoking status: Never Smoker   . Smokeless tobacco: Never Used  . Alcohol Use: 1.2 - 1.8 oz/week    2-3 Cans of beer per week     Comment: 2-3 drinks on weekend  . Drug Use: Not on file  . Sexual Activity: Not on file   Other Topics Concern  . Not on file   Social History Narrative   Works at [employer] alone   Enjoys listening to music, hanging out with friends   Has a son who was born 2010. He shares custody          Past Surgical History  Procedure Laterality Date  . Spine surgery  2002    for scoliosis  . Scoliosis  2002    Family History  Problem Relation Age of Onset  . Hypertension Mother   . Alcohol abuse Father   . Liver disease Father     No Known Allergies  Current Outpatient  Prescriptions on File Prior to Visit  Medication Sig Dispense Refill  . FLUoxetine (PROZAC) 20 MG tablet Take 1 tablet (20 mg total) by mouth daily.  30 tablet  2  . venlafaxine XR (EFFEXOR-XR) 37.5 MG 24 hr capsule One tablet by mouth once daily for 1 week. Then increase to 2 tabs by mouth once daily  60 capsule  1   No current facility-administered medications on file prior to visit.    BP 138/98  Pulse 71  Temp(Src) 98.5 F (36.9 C) (Oral)  Resp 16  Ht 5\' 9"  (1.753 m)  Wt 170 lb 1.9 oz (77.166 kg)  BMI 25.11 kg/m2  SpO2 99%       Objective:   Physical Exam  Constitutional: He is oriented to person, place, and time. He appears well-nourished.  Cardiovascular: Normal rate and regular rhythm.   Pulmonary/Chest: Breath sounds normal. No respiratory distress. He has no wheezes.  Neurological: He is alert and oriented to person, place, and time.  Skin: Skin is warm and dry.  Psychiatric: He has a normal mood and affect. His behavior is normal.          Assessment & Plan:  I have personally seen and examined patient and agree with Raymond Rieger  NP student's assessment and plan.

## 2013-02-23 ENCOUNTER — Other Ambulatory Visit: Payer: Self-pay | Admitting: Family

## 2013-02-24 ENCOUNTER — Telehealth: Payer: Self-pay | Admitting: Family

## 2013-02-24 ENCOUNTER — Other Ambulatory Visit: Payer: Self-pay | Admitting: Family

## 2013-02-24 MED ORDER — VENLAFAXINE HCL ER 37.5 MG PO CP24: ORAL_CAPSULE | ORAL | Status: DC

## 2013-02-24 NOTE — Telephone Encounter (Signed)
Refill sent to pharmacy.   

## 2013-02-24 NOTE — Telephone Encounter (Signed)
Called his parmacy and they said he has no more refills of his effexor.  He is out.

## 2013-03-27 ENCOUNTER — Other Ambulatory Visit: Payer: Self-pay | Admitting: Family

## 2013-03-28 NOTE — Telephone Encounter (Signed)
Left message for patient to return my call.

## 2013-03-28 NOTE — Telephone Encounter (Signed)
30 day supply Effexor XR sent to pharmacy.  Pt last seen 01/2013 and advised 2 week follow up of BP.  Please call pt to arrange follow up very soon.

## 2013-03-28 NOTE — Telephone Encounter (Signed)
Patient scheduled bp check for tomorrow

## 2013-03-29 ENCOUNTER — Ambulatory Visit (INDEPENDENT_AMBULATORY_CARE_PROVIDER_SITE_OTHER): Payer: 59 | Admitting: Family

## 2013-03-29 ENCOUNTER — Telehealth: Payer: Self-pay | Admitting: Family

## 2013-03-29 VITALS — BP 148/100 | HR 88 | Temp 98.0°F | Resp 16 | Ht 69.0 in | Wt 182.0 lb

## 2013-03-29 DIAGNOSIS — F341 Dysthymic disorder: Secondary | ICD-10-CM

## 2013-03-29 DIAGNOSIS — R030 Elevated blood-pressure reading, without diagnosis of hypertension: Secondary | ICD-10-CM

## 2013-03-29 DIAGNOSIS — N529 Male erectile dysfunction, unspecified: Secondary | ICD-10-CM

## 2013-03-29 MED ORDER — SILDENAFIL CITRATE 20 MG PO TABS: ORAL_TABLET | ORAL | Status: DC

## 2013-03-29 MED ORDER — FLUOXETINE HCL 20 MG PO TABS: 30.0000 mg | ORAL_TABLET | Freq: Every day | ORAL | Status: AC

## 2013-03-29 NOTE — Assessment & Plan Note (Signed)
Stable, adjust meds as noted below.

## 2013-03-29 NOTE — Assessment & Plan Note (Signed)
Could be secondary to effexor. He does not wish to add additional meds if it is not necessary. Will taper off of effexor, increase prozac friom 20mg  to 30 mg.   ( see AVS for instructions).  Follow up in 1 month. If BP still elevated, start bp med.

## 2013-03-29 NOTE — Progress Notes (Signed)
Pre visit review using our clinic review tool, if applicable. No additional management support is needed unless otherwise documented below in the visit note. 

## 2013-03-29 NOTE — Patient Instructions (Signed)
Decrease effexor to 1 tablet by mouth daily for 2 weeks, then 1 tab every other day for 1 week, then stop. Increase prozac from 20mg  to 30mg . You may use sildenafil prior to sexual activity as needed.  Follow up in 1 month.

## 2013-03-29 NOTE — Telephone Encounter (Signed)
Form faxed to [facility] at [phone] at 3:40pm today.

## 2013-03-29 NOTE — Assessment & Plan Note (Signed)
Seems to be a worsening issue for him since he has started prozac. Will give trial of generic sildenafil ( brand agents do not appear to be covered by his plan)

## 2013-03-29 NOTE — Progress Notes (Signed)
   Subjective:    Patient ID: Raymond NettlesMichael J Ho, male    DOB: 1985-04-14, 28 y.o.   MRN: [id number]  HPI  [name] is a 28 yr old male who presents today initially for nurse visit BP check. BP was noted to still be elevated so I am asked to evaluate the patient.   Anxiety/depression- well controlled on effexor and fluoxetine. Notes worsening ED though since the prozac was started.  Would consider medication for the ED.   Review of Systems See HPI  Past Medical History  Diagnosis Date  . Allergy   . Depression   . Scoliosis     History   Social History  . Marital Status: Single    Spouse Name: N/A    Number of Children: 1  . Years of Education: N/A   Occupational History  . HEALTH CARE BILLING    Social History Main Topics  . Smoking status: Never Smoker   . Smokeless tobacco: Never Used  . Alcohol Use: 1.2 - 1.8 oz/week    2-3 Cans of beer per week     Comment: 2-3 drinks on weekend  . Drug Use: Not on file  . Sexual Activity: Not on file   Other Topics Concern  . Not on file   Social History Narrative   Works at [employer]   Single   Lives alone   Enjoys listening to music, hanging out with friends   Has a son who was born 2010. He shares custody          Past Surgical History  Procedure Laterality Date  . Spine surgery  2002    for scoliosis  . Scoliosis  2002    Family History  Problem Relation Age of Onset  . Hypertension Mother   . Alcohol abuse Father   . Liver disease Father     No Known Allergies  Current Outpatient Prescriptions on File Prior to Visit  Medication Sig Dispense Refill  . FLUoxetine (PROZAC) 20 MG tablet Take 1 tablet (20 mg total) by mouth daily.  30 tablet  2  . venlafaxine XR (EFFEXOR-XR) 37.5 MG 24 hr capsule TAKE 2 CAPS  BY MOUTH ONCE DAILY  60 capsule  0   No current facility-administered medications on file prior to visit.    BP 148/100  Pulse 88  Temp(Src) 98 F (36.7 C) (Oral)  Resp 16  Ht 5\' 9"  (1.753 m)   Wt 182 lb 0.6 oz (82.573 kg)  BMI 26.87 kg/m2  SpO2 98%       Objective:   Physical Exam  Constitutional: He is oriented to person, place, and time. He appears well-developed and well-nourished. No distress.  Neurological: He is alert and oriented to person, place, and time.  Psychiatric: He has a normal mood and affect. His behavior is normal. Judgment and thought content normal.          Assessment & Plan:

## 2013-03-29 NOTE — Telephone Encounter (Signed)
Received PA form for Sildenafil Citrate 20mg , form forward to nurse

## 2013-03-29 NOTE — Telephone Encounter (Signed)
Form intiated. Spoke with pt and advised of PA process and has he tried other medications for this in the past.  He thinks he took cialis in the past but doesn't remember why he stopped it and doesn't think he had any side effects from it. Form forwarded to Provider for signature.

## 2013-03-31 ENCOUNTER — Telehealth: Payer: Self-pay | Admitting: *Deleted

## 2013-03-31 NOTE — Telephone Encounter (Signed)
Received denial from [facility] for generic viagra and pt previously stated that Brand name was too expensive.  Notified pharmacy and pt.  He will contact his insurance to see if there are covered alternatives and will let us know the outcome.

## 2013-03-31 NOTE — Telephone Encounter (Signed)
Opened in error

## 2013-04-14 ENCOUNTER — Telehealth: Payer: Self-pay | Admitting: *Deleted

## 2013-04-14 MED ORDER — VENLAFAXINE HCL ER 37.5 MG PO CP24: ORAL_CAPSULE | ORAL | Status: AC

## 2013-04-14 NOTE — Telephone Encounter (Signed)
Pt left message requesting effexor XR refill be sent to [facility]. Refill sent, left message on voicemail that request was completed and to call if any questions.

## 2013-05-01 ENCOUNTER — Ambulatory Visit: Payer: 59 | Admitting: Family

## 2013-05-01 DIAGNOSIS — Z0289 Encounter for other administrative examinations: Secondary | ICD-10-CM

## 2013-11-09 ENCOUNTER — Telehealth: Payer: Self-pay | Admitting: Family

## 2013-11-09 NOTE — Telephone Encounter (Signed)
Caller name: Casimiro NeedleMichael Relation to pt: self Call back number: [phone] [facility] [address]  Reason for call:   Patient states that the medication that he is on is still causing him to have erectile dysfunction and would like to know if something could be called in for this

## 2013-11-10 MED ORDER — SILDENAFIL CITRATE 20 MG PO TABS: ORAL_TABLET | ORAL | Status: AC

## 2013-11-10 NOTE — Telephone Encounter (Signed)
Rx was sent to [facility] on [address] [place] instead of [facility] per pt's request.

## 2013-11-10 NOTE — Telephone Encounter (Signed)
Spoke with pt. He is requesting refill of sildenafil. States he never picked up the original rx due to insurance problem but has different insurance now. Sent refill, #20 x no refills. Advised pt that he is past due for follow up and scheduled appt for 11/22/13 at 2pm.

## 2013-11-22 ENCOUNTER — Ambulatory Visit: Payer: Self-pay | Admitting: Family

## 2013-11-22 DIAGNOSIS — Z0289 Encounter for other administrative examinations: Secondary | ICD-10-CM

## 2013-11-28 ENCOUNTER — Ambulatory Visit: Payer: Self-pay | Admitting: Family

## 2013-12-04 ENCOUNTER — Ambulatory Visit: Payer: Self-pay | Admitting: Family

## 2013-12-18 ENCOUNTER — Encounter: Payer: Self-pay | Admitting: Family

## 2013-12-18 ENCOUNTER — Telehealth: Payer: Self-pay | Admitting: *Deleted

## 2013-12-18 ENCOUNTER — Ambulatory Visit: Payer: Self-pay | Admitting: Family

## 2013-12-18 NOTE — Telephone Encounter (Signed)
Pt did not show for appointment 12/18/2013 at 4:15 for BP check

## 2014-01-02 ENCOUNTER — Telehealth: Payer: Self-pay | Admitting: Family

## 2014-01-02 NOTE — Telephone Encounter (Signed)
Dismissal Letter sent by Certified Mail 01/02/2014  Dismissal Letter returned Unclaimed 01/31/2014  Dismissal Letter sent by Certified Mail to [address] [place] 01/31/2014  Received the Return Receipt showing someone picked up the Dismissal 02/05/2014

## 2014-01-03 ENCOUNTER — Telehealth: Payer: Self-pay | Admitting: Family

## 2014-01-03 NOTE — Telephone Encounter (Signed)
See notes below. Advise if willing to take him back as a patient.

## 2014-01-03 NOTE — Telephone Encounter (Signed)
Caller name:Raymond Ho Relation to NW:GNFApt:self Call back number:[phone] Pharmacy:  Reason for call: pt never received his letter of dismissal because of his address change, pt wants to know if the letter can be sent again to: [address] [place] [phone]. Pt would also like to know if there is any way he can be accepted back

## 2014-01-03 NOTE — Telephone Encounter (Signed)
Please resend letter to address below. Please advise pt that our policy is pts eligigle for dismissal agter 3 no shows. He has had 7 no shows. Unfortunately we are unable to take him back as a patient.

## 2014-01-03 NOTE — Telephone Encounter (Signed)
FYI

## 2014-01-04 NOTE — Telephone Encounter (Signed)
New letter printed. Given to [place] to send.
